# Patient Record
Sex: Female | Born: 1975 | Race: White | Hispanic: Yes | Marital: Married
Health system: Southern US, Community
[De-identification: ages and names within clinical notes are randomized; demographics above are authoritative.]

---

## 2009-12-18 ENCOUNTER — Ambulatory Visit (HOSPITAL_COMMUNITY): Admission: RE | Admit: 2009-12-18 | Discharge: 2009-12-18 | Payer: Self-pay | Admitting: Unknown Physician Specialty

## 2010-01-16 ENCOUNTER — Ambulatory Visit (HOSPITAL_COMMUNITY)
Admission: RE | Admit: 2010-01-16 | Discharge: 2010-01-16 | Payer: Self-pay | Source: Home / Self Care | Admitting: Unknown Physician Specialty

## 2010-02-06 ENCOUNTER — Ambulatory Visit (HOSPITAL_COMMUNITY)
Admission: RE | Admit: 2010-02-06 | Discharge: 2010-02-06 | Payer: Self-pay | Source: Home / Self Care | Attending: Unknown Physician Specialty | Admitting: Unknown Physician Specialty

## 2010-03-29 ENCOUNTER — Ambulatory Visit (HOSPITAL_COMMUNITY)
Admission: RE | Admit: 2010-03-29 | Discharge: 2010-03-29 | Payer: Self-pay | Source: Home / Self Care | Attending: Unknown Physician Specialty | Admitting: Unknown Physician Specialty

## 2010-04-01 ENCOUNTER — Other Ambulatory Visit (HOSPITAL_COMMUNITY): Payer: Self-pay | Admitting: Maternal and Fetal Medicine

## 2010-04-01 DIAGNOSIS — Q6689 Other  specified congenital deformities of feet: Secondary | ICD-10-CM

## 2010-04-01 DIAGNOSIS — O3410 Maternal care for benign tumor of corpus uteri, unspecified trimester: Secondary | ICD-10-CM

## 2010-04-01 DIAGNOSIS — O358XX0 Maternal care for other (suspected) fetal abnormality and damage, not applicable or unspecified: Secondary | ICD-10-CM

## 2010-05-10 ENCOUNTER — Ambulatory Visit (HOSPITAL_COMMUNITY): Payer: Self-pay

## 2012-10-30 IMAGING — US US OB FOLLOW-UP
1 of 2 series · 12 of 28 positions shown · non-contrast
Comparison: none

[Series 1: us ob follow-up · 12 of 74 slices shown]
[im 1/74]
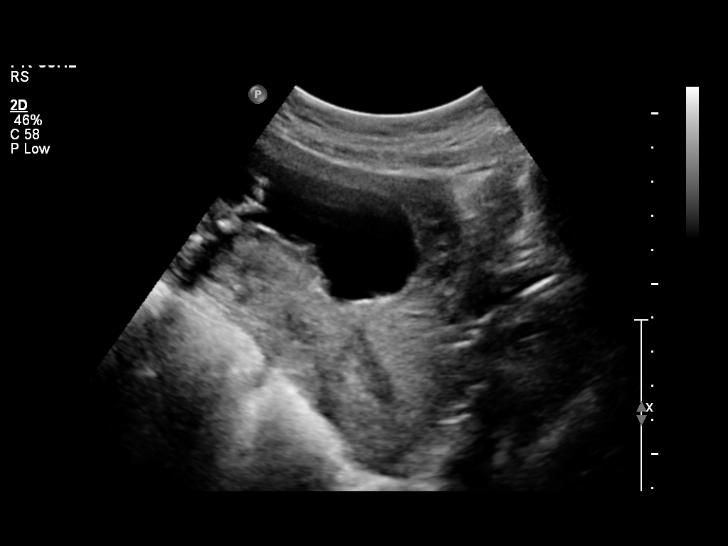
[im 6/74]
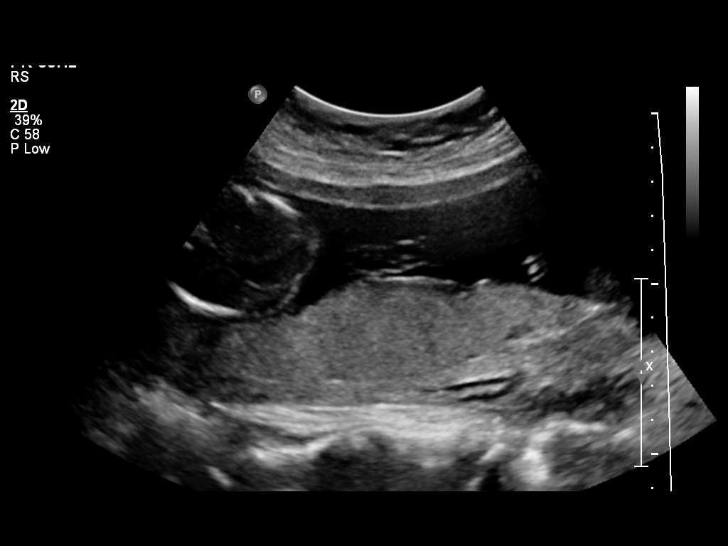
[im 12/74]
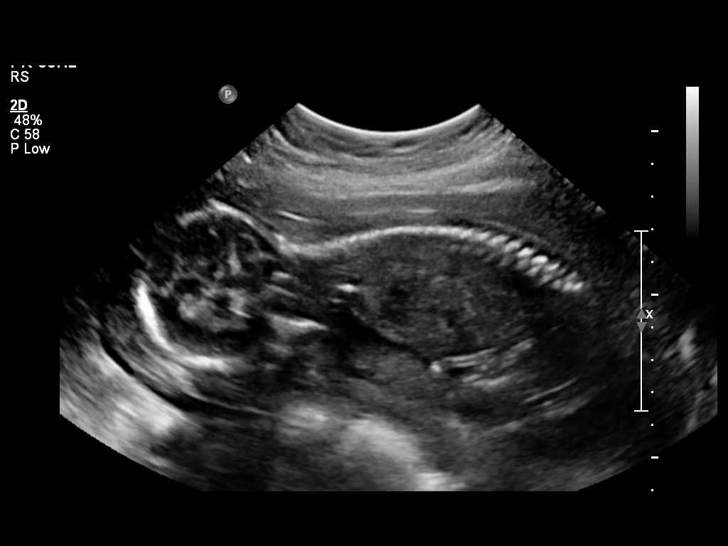
[im 20/74]
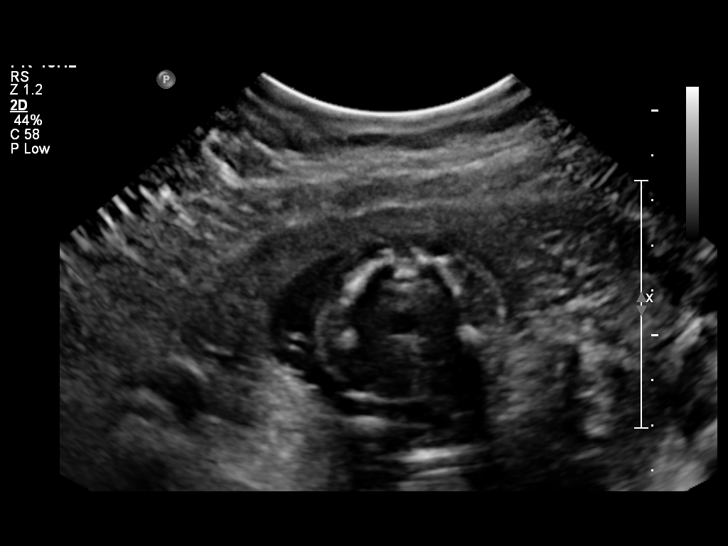
[im 26/74]
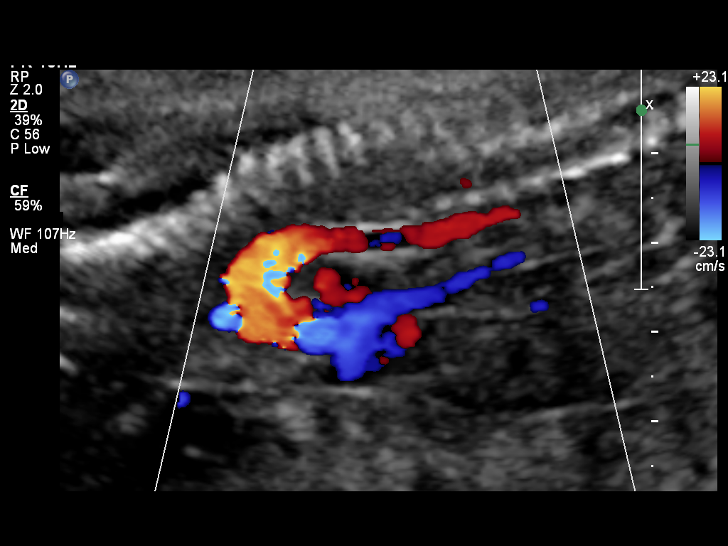
[im 31/74]
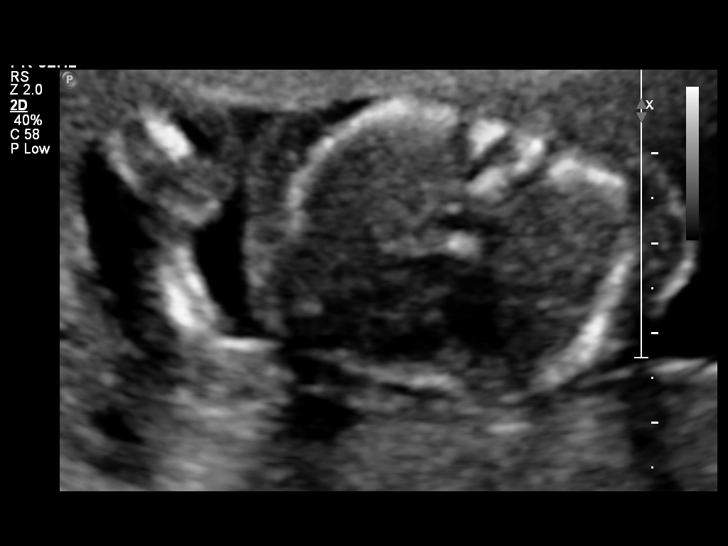
[im 40/74]
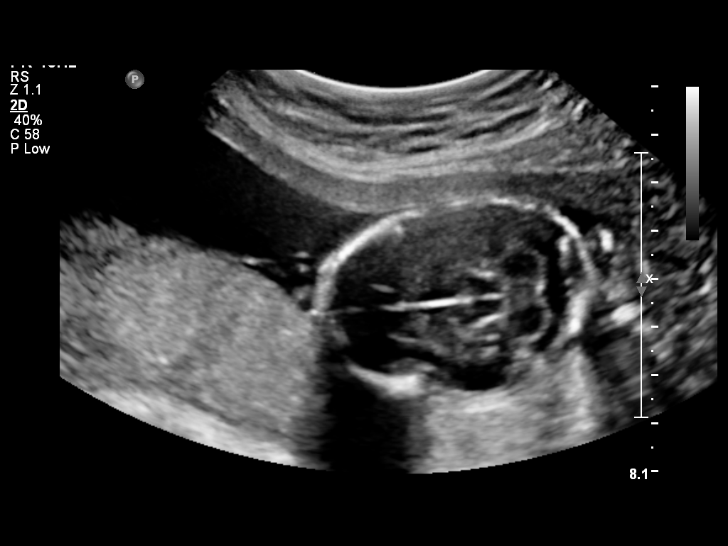
[im 45/74]
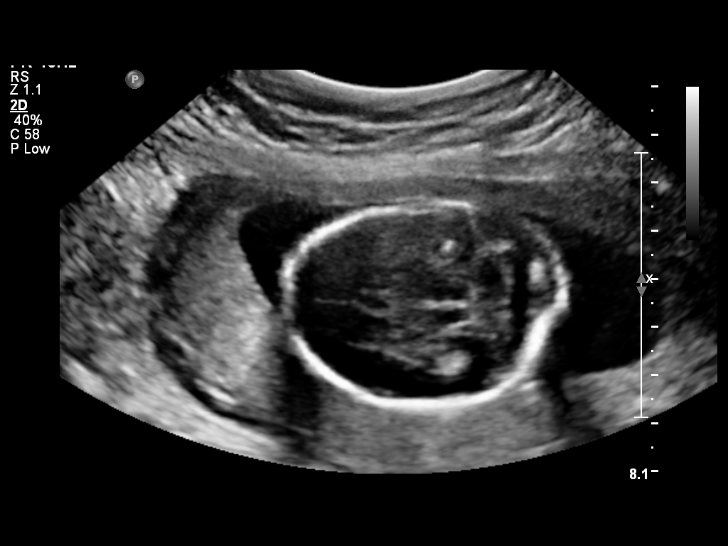
[im 51/74]
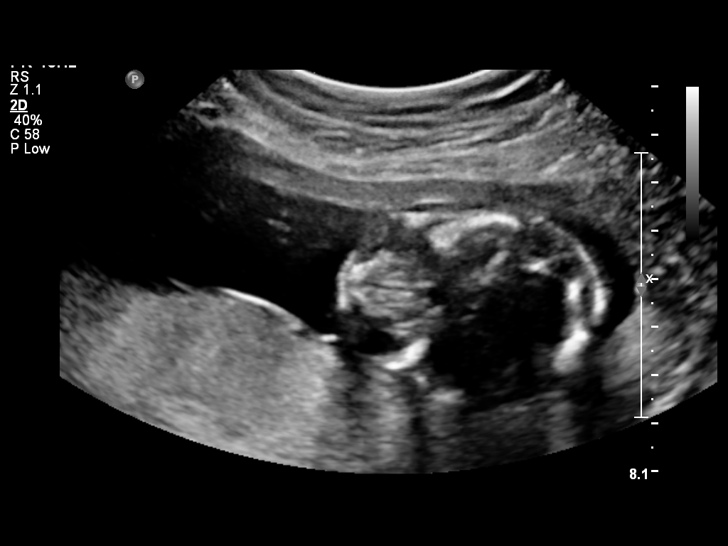
[im 59/74]
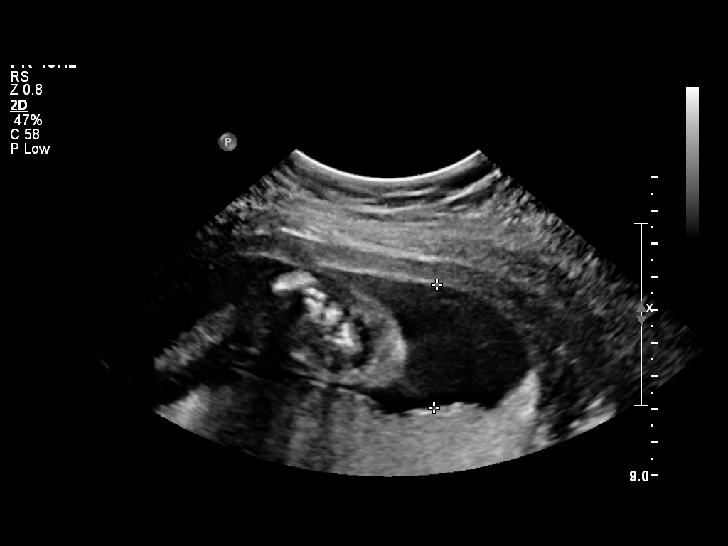
[im 65/74]
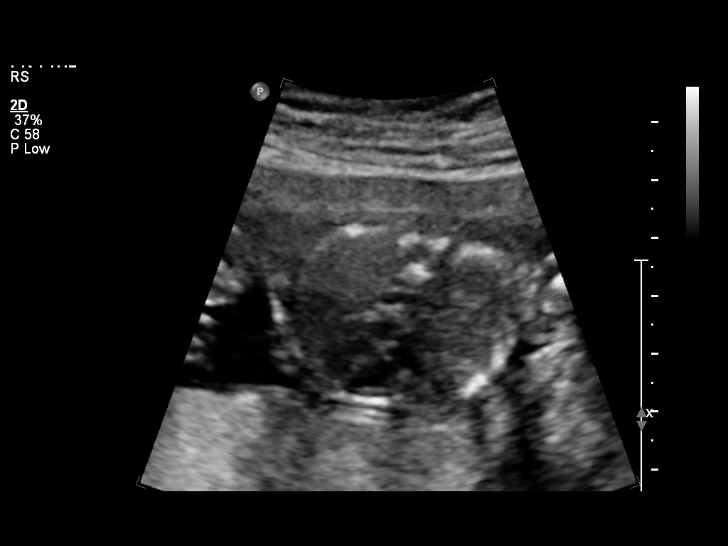
[im 71/74]
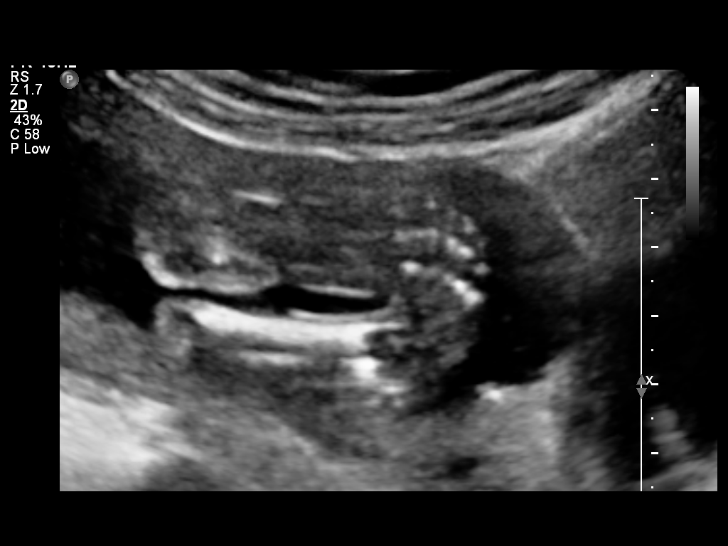

[12 of 28 positions shown; findings below may reference images not displayed]

OBSTETRICS REPORT
                      (Signed Final 01/16/2010 [DATE])

 Order#:         _O
Procedures

 US OB FOLLOW UP                                       76816.1
Indications

 Follow-up fetal anatomic evaluation
Fetal Evaluation

 Fetal Heart Rate:  147                          bpm
 Cardiac Activity:  Observed
 Presentation:      Variable
 Placenta:          Posterior, above cervical
                    os
 P. Cord            Visualized
 Insertion:

 Amniotic Fluid
 AFI FV:      Subjectively within normal limits
                                             Larg Pckt:     3.6  cm
Biometry

 BPD:     40.9  mm     G. Age:  18w 3d                CI:        68.18   70 - 86
                                                      FL/HC:      18.8   16.1 -

 HC:     158.4  mm     G. Age:  18w 5d       18  %    HC/AC:      1.11   1.09 -

 AC:     143.3  mm     G. Age:  19w 4d       58  %    FL/BPD:
 FL:      29.8  mm     G. Age:  19w 2d       40  %    FL/AC:      20.8   20 - 24
 HUM:     28.9  mm     G. Age:  19w 3d       54  %
 CER:       20  mm     G. Age:  19w 0d       45  %
 NFT:     3.88  mm

 Est. FW:     287  gm    0 lb 10 oz      47  %
Gestational Age

 LMP:           22w 6d        Date:  08/09/09                 EDD:   05/16/10
 U/S Today:     19w 0d                                        EDD:   06/12/10
 Best:          19w 2d     Det. By:  U/S (12/18/09)           EDD:   06/10/10
Anatomy
 Cranium:           Appears normal      Aortic Arch:       Appears normal
 Fetal Cavum:       Appears normal      Ductal Arch:       Appears normal
 Ventricles:        Appears normal      Diaphragm:         Appears normal
 Choroid Plexus:    Appears normal      Stomach:           Appears
                                                           normal, left
                                                           sided
 Cerebellum:        Appears normal      Abdomen:           Appears normal
 Posterior Fossa:   Appears normal      Abdominal Wall:    Appears nml
                                                           (cord insert,
                                                           abd wall)
 Nuchal Fold:       Appears normal      Cord Vessels:      Previously seen
                    (neck, nuchal
                    fold)
 Face:              Previously seen     Kidneys:           Appear normal
 Heart:             Appears normal      Bladder:           Appears normal
                    (4 chamber &
                    axis)
 RVOT:              Not well            Spine:             Appears normal
                    visualized
 LVOT:              Appears normal      Limbs:             Bilateral
                                                           clubbed feet

 Other:     Fetus appears to be a male.  5th digit visualized.
Cervix Uterus Adnexa

 Cervical Length:    3.55     cm

 Cervix:       Normal appearance by transabdominal scan.
 Uterus:       Unchanged fibroids
Comments

 Ultrasound today shows bilateral clubbed feet with no other
 anomalies seen.  However, fetal heart was not adequately
 visualized.  She will return in 3 weeks for repeat assessment
 of fetal heart.  As the fetus appears to have isolated clubbed
 feet the risk of aneuploidy is low and no invasive testing is
 warranted unless other anomalies are seen on follow up
 ultrasounds.
Impression

 Intrauterine pregnancy at 19 weeks 2 days.
 Appropriate fetal growth (47%).
 Bilateral clubbed feet.
Recommendations

 Recommend follow-up ultrasound examination in 3 weeks.

 questions or concerns.
                Zee, Rudolf

## 2013-01-10 IMAGING — US US OB FOLLOW-UP
1 series · 14 of 28 positions shown · non-contrast
Comparison: none

[Series 1: us ob follow-up · 14 of 38 slices shown]
[im 2/38]
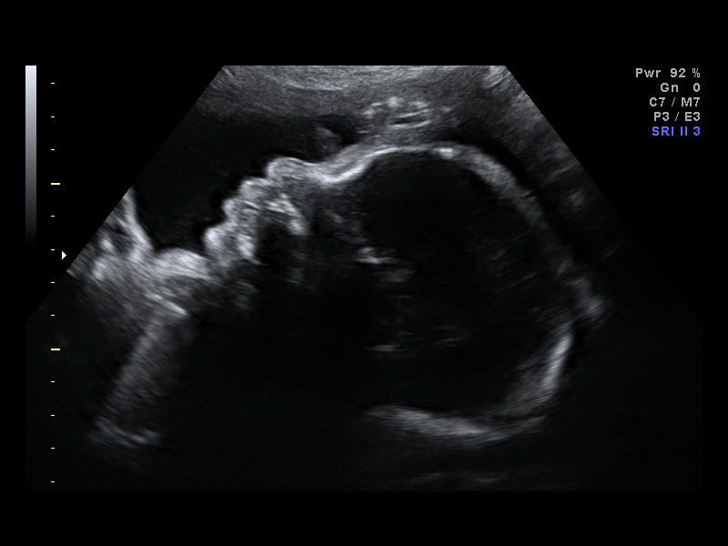
[im 5/38]
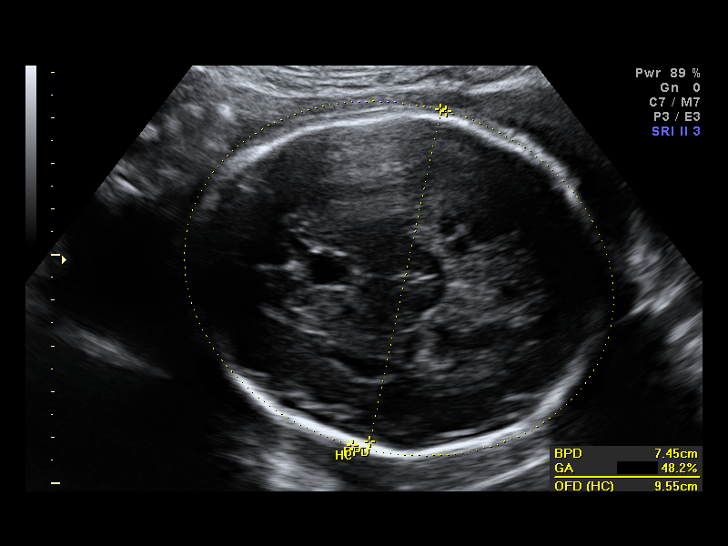
[im 7/38]
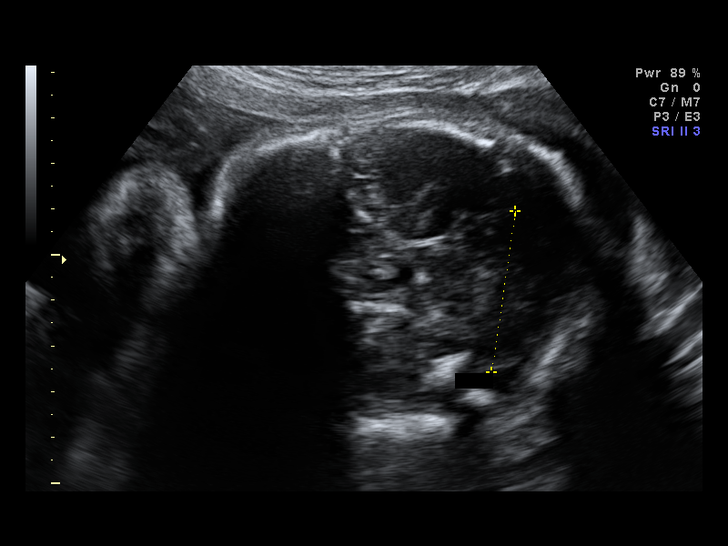
[im 10/38]
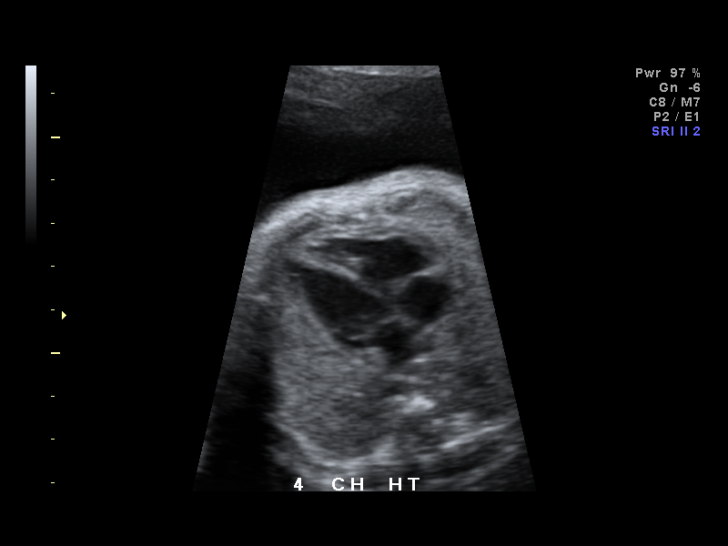
[im 13/38]
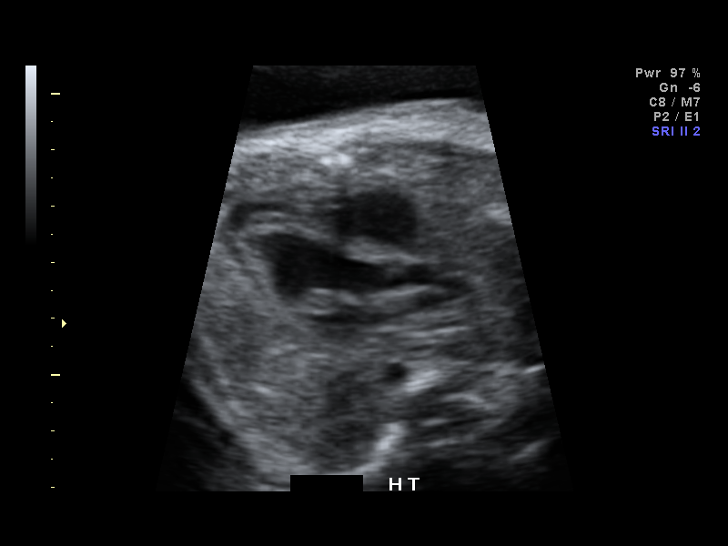
[im 16/38]
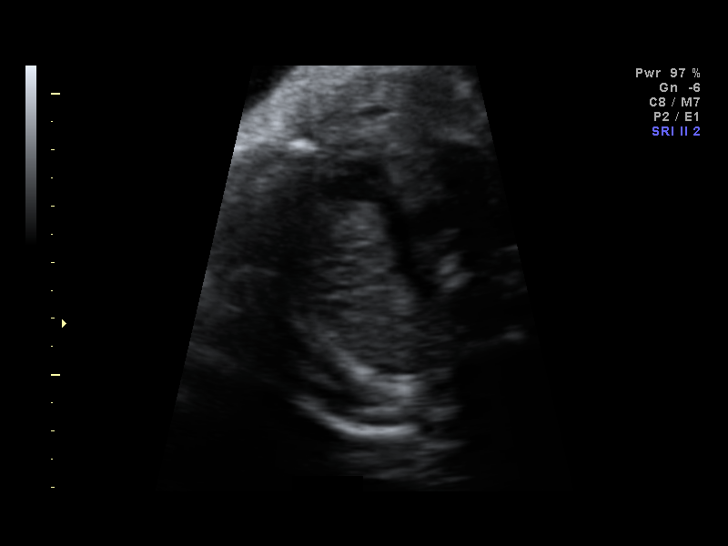
[im 18/38]
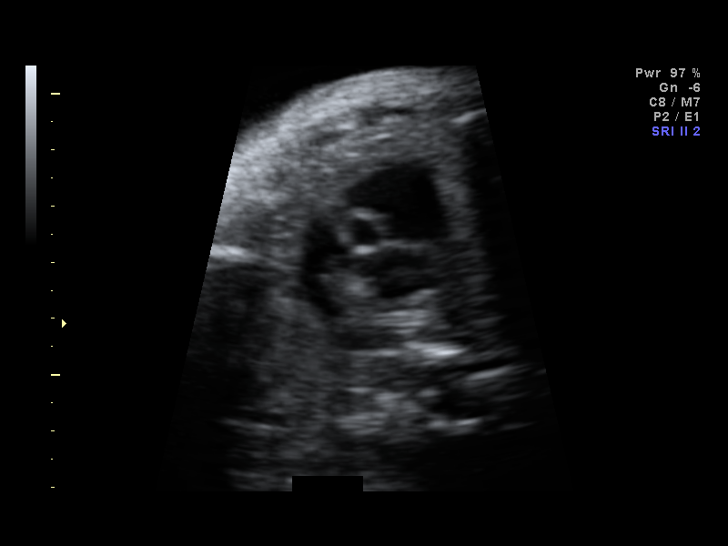
[im 21/38]
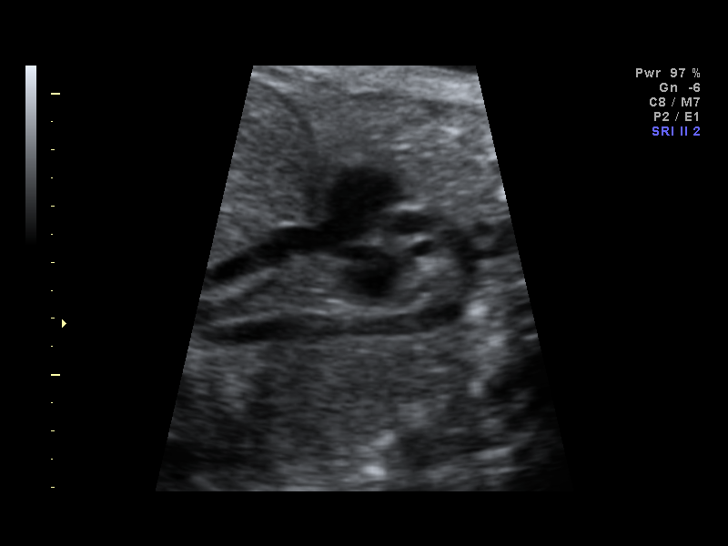
[im 24/38]
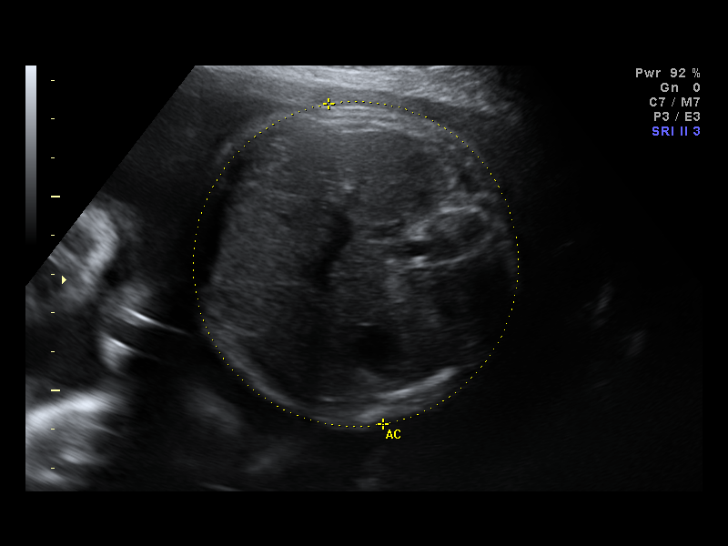
[im 27/38]
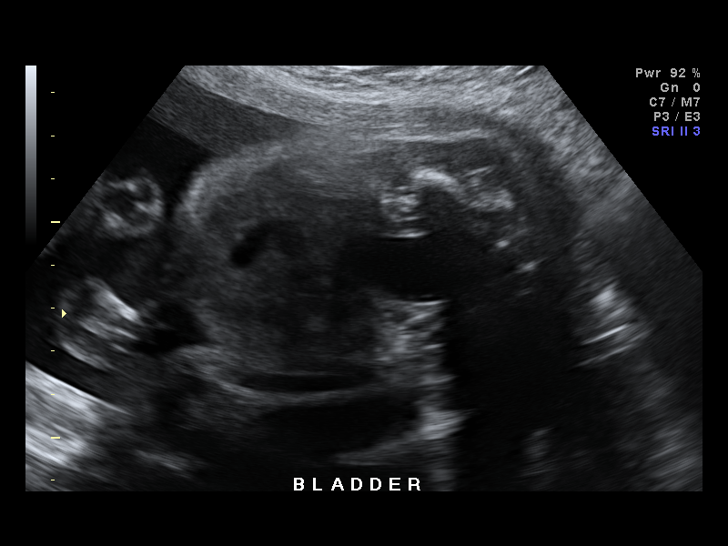
[im 29/38]
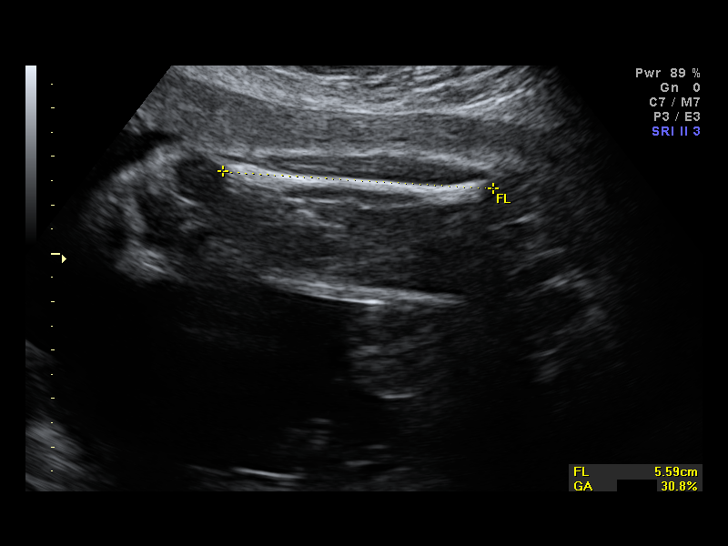
[im 32/38]
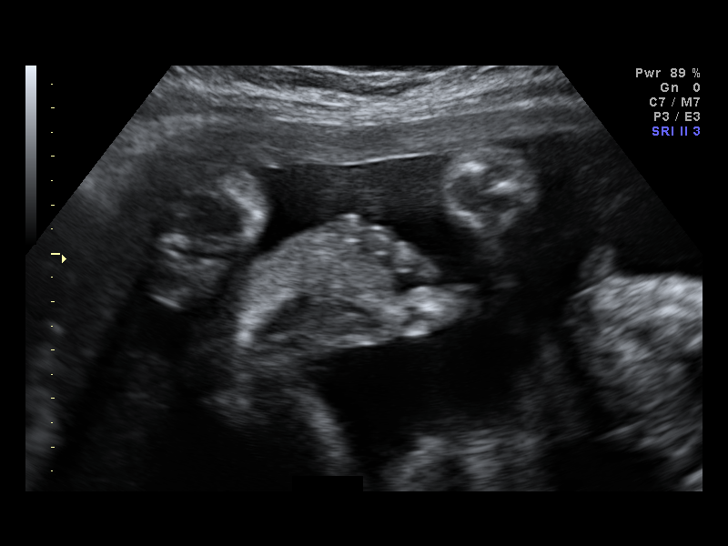
[im 35/38]
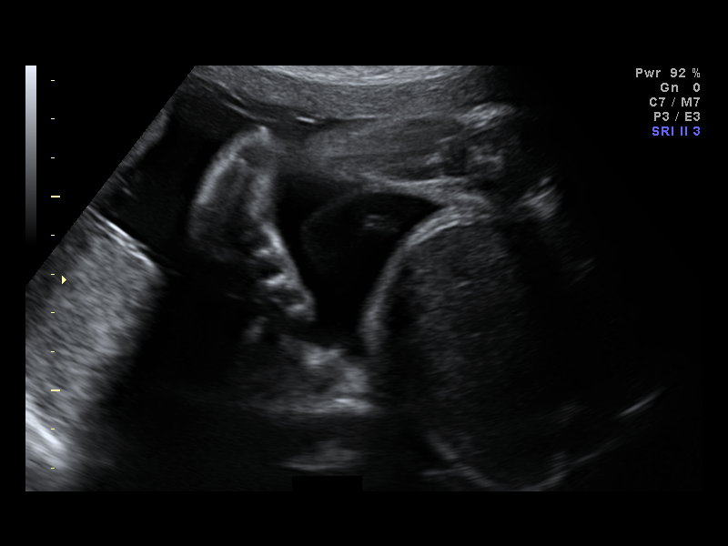
[im 38/38]
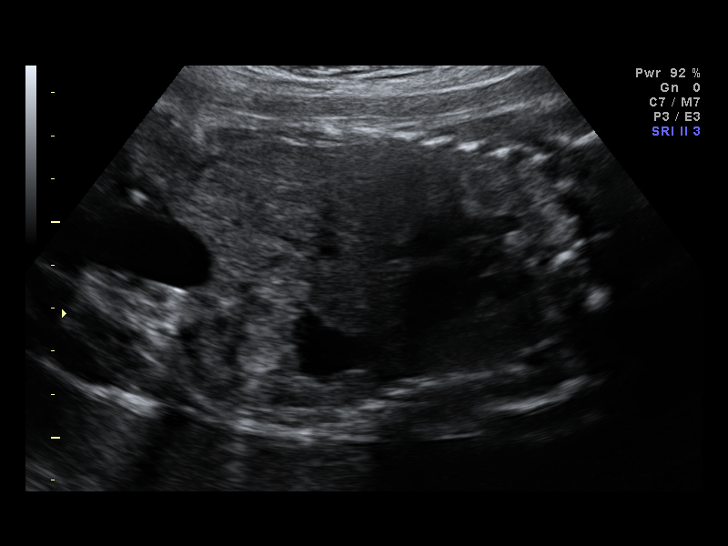

[14 of 28 positions shown; findings below may reference images not displayed]

Canned report from images found in remote index.

Refer to host system for actual result text.

## 2021-07-23 ENCOUNTER — Other Ambulatory Visit: Payer: Self-pay | Admitting: *Deleted

## 2021-07-23 DIAGNOSIS — Z1231 Encounter for screening mammogram for malignant neoplasm of breast: Secondary | ICD-10-CM

## 2021-08-21 ENCOUNTER — Ambulatory Visit
Admission: RE | Admit: 2021-08-21 | Discharge: 2021-08-21 | Disposition: A | Discharge status: Home / Self Care | Payer: Self-pay | Source: Ambulatory Visit | Attending: *Deleted | Admitting: *Deleted

## 2021-08-21 DIAGNOSIS — Z1231 Encounter for screening mammogram for malignant neoplasm of breast: Secondary | ICD-10-CM

## 2022-07-17 ENCOUNTER — Telehealth: Payer: Self-pay

## 2022-07-17 NOTE — Telephone Encounter (Signed)
Telephoned patient at mobile number, voice mail unavailable at this time.

## 2022-07-31 ENCOUNTER — Other Ambulatory Visit: Payer: Self-pay | Admitting: Physician Assistant

## 2022-07-31 DIAGNOSIS — Z1231 Encounter for screening mammogram for malignant neoplasm of breast: Secondary | ICD-10-CM

## 2022-08-04 NOTE — Congregational Nurse Program (Signed)
Updated Patients' PCP and Care Coordination status as it relates to [facility]'s eligibility  to assist other health care teams best coordinate pt's health services

## 2022-08-04 NOTE — Congregational Nurse Program (Signed)
Reviewed and Received pt [facility] Uninsured Program renewal enrollment packet to review health risk stratisfication on 5.30.24  Pt has been dual enrolled at the [facility] with recent visit of 5.16.24 with a PCP follow visit.   Pt has a HTN dx with no meds prescribed and with choosing to make basic lifestyle modifications versus the use of medications.  Pt does have other medication for cholesterol condition that he is taken meds for.    Pt reported  last BP at [facility] wa 133/86 without use of medication.   Pt next appointment will is a 37month follow up visit with PCP at [facility]  on 01/20/23  No [facility] or health questionnaire was completed at time of enrollment, however pt [employer] EMR was reviewed to identify any needs.

## 2022-09-29 ENCOUNTER — Ambulatory Visit (HOSPITAL_COMMUNITY)
Admission: RE | Admit: 2022-09-29 | Discharge: 2022-09-29 | Disposition: A | Discharge status: Home / Self Care | Payer: Self-pay | Source: Ambulatory Visit | Attending: Physician Assistant | Admitting: Physician Assistant

## 2022-09-29 ENCOUNTER — Encounter (HOSPITAL_COMMUNITY): Payer: Self-pay | Admitting: Radiology

## 2022-09-29 DIAGNOSIS — Z1231 Encounter for screening mammogram for malignant neoplasm of breast: Secondary | ICD-10-CM | POA: Insufficient documentation

## 2023-03-26 ENCOUNTER — Telehealth: Payer: Self-pay

## 2023-03-26 NOTE — Telephone Encounter (Addendum)
Attempted Wellness check in with patient on today and was assisted by [employer]; however pt was unavailable at the time of call;  A chart review of patients medical home at [facility]  indicates that the patient has stayed connected with their provider and is maintaining access to getting their care and any medications,when needed  with a last encounter on 11.19.24 and with next scheduled PCP appt time of 05.5.25 for her 6 month followup

## 2023-10-07 ENCOUNTER — Other Ambulatory Visit: Payer: Self-pay | Admitting: Physician Assistant

## 2023-10-07 DIAGNOSIS — Z1231 Encounter for screening mammogram for malignant neoplasm of breast: Secondary | ICD-10-CM

## 2023-10-14 ENCOUNTER — Ambulatory Visit (HOSPITAL_COMMUNITY)
Admission: RE | Admit: 2023-10-14 | Discharge: 2023-10-14 | Disposition: A | Discharge status: Home / Self Care | Payer: Self-pay | Source: Ambulatory Visit | Attending: Physician Assistant | Admitting: Physician Assistant

## 2023-10-14 ENCOUNTER — Encounter (HOSPITAL_COMMUNITY): Payer: Self-pay

## 2023-10-14 DIAGNOSIS — Z1231 Encounter for screening mammogram for malignant neoplasm of breast: Secondary | ICD-10-CM | POA: Insufficient documentation
